# Patient Record
Sex: Female | Born: 1990 | Race: White | Hispanic: Yes | Marital: Single | Smoking: Former smoker
Health system: Southern US, Community
[De-identification: ages and names within clinical notes are randomized; demographics above are authoritative.]

## PROBLEM LIST (undated history)

## (undated) DIAGNOSIS — N390 Urinary tract infection, site not specified: Secondary | ICD-10-CM

## (undated) DIAGNOSIS — Z789 Other specified health status: Secondary | ICD-10-CM

## (undated) DIAGNOSIS — B379 Candidiasis, unspecified: Secondary | ICD-10-CM

## (undated) HISTORY — DX: Other specified health status: Z78.9

## (undated) HISTORY — PX: WISDOM TOOTH EXTRACTION: SHX21

## (undated) HISTORY — DX: Urinary tract infection, site not specified: N39.0

## (undated) HISTORY — DX: Candidiasis, unspecified: B37.9

---
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
## 2013-01-13 ENCOUNTER — Other Ambulatory Visit: Payer: Self-pay | Admitting: Physician Assistant

## 2013-02-09 ENCOUNTER — Other Ambulatory Visit: Payer: Self-pay | Admitting: Physician Assistant

## 2013-02-10 ENCOUNTER — Encounter: Payer: Self-pay | Admitting: Family Medicine

## 2013-02-10 NOTE — Telephone Encounter (Signed)
Medication refill for one time only.  Patient needs to be seen.  Letter sent for patient to call and schedule 

## 2013-03-09 ENCOUNTER — Other Ambulatory Visit: Payer: Self-pay | Admitting: Physician Assistant

## 2013-09-21 ENCOUNTER — Other Ambulatory Visit: Payer: Self-pay | Admitting: Physician Assistant

## 2013-09-22 ENCOUNTER — Encounter: Payer: Self-pay | Admitting: Family Medicine

## 2013-09-22 NOTE — Telephone Encounter (Signed)
Medication refill for one time only.  Patient needs to be seen.  Letter sent for patient to call and schedule 

## 2013-10-23 ENCOUNTER — Other Ambulatory Visit: Payer: Self-pay | Admitting: Physician Assistant

## 2014-11-12 ENCOUNTER — Ambulatory Visit: Payer: Self-pay | Admitting: Physician Assistant

## 2014-11-16 ENCOUNTER — Encounter: Payer: Self-pay | Admitting: Physician Assistant

## 2014-11-16 ENCOUNTER — Ambulatory Visit (INDEPENDENT_AMBULATORY_CARE_PROVIDER_SITE_OTHER): Payer: 59 | Admitting: Physician Assistant

## 2014-11-16 VITALS — BP 110/70 | HR 61 | Temp 98.7°F | Resp 19 | Wt 136.0 lb

## 2014-11-16 DIAGNOSIS — Z124 Encounter for screening for malignant neoplasm of cervix: Secondary | ICD-10-CM | POA: Diagnosis not present

## 2014-11-16 DIAGNOSIS — Z309 Encounter for contraceptive management, unspecified: Secondary | ICD-10-CM | POA: Insufficient documentation

## 2014-11-16 DIAGNOSIS — Z3041 Encounter for surveillance of contraceptive pills: Secondary | ICD-10-CM

## 2014-11-16 DIAGNOSIS — Z01419 Encounter for gynecological examination (general) (routine) without abnormal findings: Secondary | ICD-10-CM

## 2014-11-16 MED ORDER — DESOGESTREL-ETHINYL ESTRADIOL 0.15-0.02/0.01 MG (21/5) PO TABS: ORAL_TABLET | ORAL | Status: DC

## 2014-11-16 NOTE — Progress Notes (Signed)
Patient ID: Allen NorrisGemma L Pond MRN: [id number], DOB: 02/18/1991, 24 y.o. Date of Encounter: @DATE @  Chief Complaint:  Chief Complaint  Patient presents with  . Med Refill    HPI: 24 y.o. year old female  Presents for refill on contraception / birth-control pills. I saw her for office visit 08/14/2011. At that time she was wanting to start birth control pills for the first time.  At that time, she had never been on any type of contraception. Reported that her menses came on a regular basis approximately every 28 days and lasted 5-7 days with minimal cramping on the first day only and the flow was not very heavy. She had no GYN history, no past medical history. She has no history of smoking. At the time of that visit was not a smoker and still is not a smoker at this time. Last menstrual period 11/03/14. She says that she has actually been out of birth control pills for almost a year now. Says that in addition to needing contraception she also has noticed that since being off of the pill her acne is worse and hormone fluctuations are more noticeable. She states that she currently is a Consulting civil engineerstudent in college as well as also working at [employer]. Says that she should graduate next May--after that, plans to continue to get her Masters.  No other complaints or concerns today.   History reviewed. No pertinent past medical history.   Home Meds: Outpatient Prescriptions Prior to Visit  Medication Sig Dispense Refill  . AZURETTE 0.15-0.02/0.01 MG (21/5) tablet TAKE AS DIRECTED 28 tablet 0   No facility-administered medications prior to visit.    Allergies: Not on File  History   Social History  . Marital Status: Single    Spouse Name: N/A  . Number of Children: N/A  . Years of Education: N/A   Occupational History  . Not on file.   Social History Main Topics  . Smoking status: Current Every Day Smoker -- 0.50 packs/day    Types: Cigarettes    Start date: 11/01/2011  . Smokeless  tobacco: Not on file  . Alcohol Use: 0.0 oz/week    0 Standard drinks or equivalent per week  . Drug Use: 5.00 per week    Special: Marijuana  . Sexual Activity: Yes   Other Topics Concern  . Not on file   Social History Narrative  . No narrative on file    History reviewed. No pertinent family history.   Review of Systems:  See HPI for pertinent ROS. All other ROS negative.    Physical Exam: Blood pressure 110/70, pulse 61, temperature 98.7 F (37.1 C), temperature source Oral, resp. rate 19, weight 136 lb (61.689 kg)., There is no height on file to calculate BMI. General: WNWD Female. Appears in no acute distress.  Neck: Supple. No thyromegaly. No lymphadenopathy. Breast Exam: Inspection is normal. Symmetrical. No rashes or skin changes. No masses with palpation. No nipple discharge. Lungs: Clear bilaterally to auscultation without wheezes, rales, or rhonchi. Breathing is unlabored. Heart: RRR with S1 S2. No murmurs, rubs, or gallops. Pelvic Exam: External genitalia normal. Vaginal mucosa normal. Cervix normal. Bimanual exam normal with no adnexal mass. Uterus normal size. No cervical motion tenderness. Musculoskeletal:  Strength and tone normal for age. Extremities/Skin: Warm and dry. Neuro: Alert and oriented X 3. Moves all extremities spontaneously. Gait is normal. CNII-XII grossly in tact. Psych:  Responds to questions appropriately with a normal affect.  ASSESSMENT AND PLAN:  24 y.o. year old female with  1. Encounter for surveillance of contraceptive pills - desogestrel-ethinyl estradiol (AZURETTE) 0.15-0.02/0.01 MG (21/5) tablet; TAKE AS DIRECTED  Dispense: 1 Package; Refill: 11  2. Encounter for cervical Pap smear with pelvic exam - desogestrel-ethinyl estradiol (AZURETTE) 0.15-0.02/0.01 MG (21/5) tablet; TAKE AS DIRECTED  Dispense: 1 Package; Refill: 11 - PAP, ThinPrep ASCUS Rflx HPV Rflx Type  Will follow-up with her with results of Pap smear. Routine  follow-up office visit 1 year. Follow-up sooner if needed.   Signed, [address]Mary Beth SpringfieldDixon, [place], BSFM 11/16/2014 12:00 PM

## 2014-11-17 LAB — PAP THINPREP ASCUS RFLX HPV RFLX TYPE

## 2014-11-18 ENCOUNTER — Encounter: Payer: Self-pay | Admitting: Family Medicine

## 2015-10-30 ENCOUNTER — Other Ambulatory Visit: Payer: Self-pay | Admitting: Physician Assistant

## 2015-11-01 ENCOUNTER — Encounter: Payer: Self-pay | Admitting: Family Medicine

## 2015-11-01 NOTE — Telephone Encounter (Signed)
Medication refill for one time only.  Patient needs to be seen.  Letter sent for patient to call and schedule 

## 2015-11-25 ENCOUNTER — Encounter: Payer: Self-pay | Admitting: *Deleted

## 2015-11-25 ENCOUNTER — Other Ambulatory Visit: Payer: Self-pay | Admitting: Physician Assistant

## 2015-11-25 NOTE — Telephone Encounter (Signed)
Medication filled x1 with no refills.   Requires office visit before any further refills can be given.   Letter sent.  

## 2015-12-27 ENCOUNTER — Other Ambulatory Visit: Payer: Self-pay | Admitting: Physician Assistant

## 2015-12-27 ENCOUNTER — Encounter: Payer: Self-pay | Admitting: Family Medicine

## 2015-12-27 NOTE — Telephone Encounter (Signed)
Medication refill for one time only.  Patient needs to be seen.  Letter sent for patient to call and schedule 

## 2016-01-07 ENCOUNTER — Telehealth: Payer: Self-pay | Admitting: Physician Assistant

## 2016-01-07 NOTE — Telephone Encounter (Signed)
Pt states that her birth control is too expensive and is hoping that we can call in a cheaper medication to the [facility] in [place]

## 2016-01-08 NOTE — Telephone Encounter (Signed)
LOV 11/2014----Due for OV.  Have her schedule OV.  Will give her one month supply of different OCT to use in interim.  Mircette -- Take 1 daily---1 pack + 0

## 2016-01-10 MED ORDER — DESOGESTREL-ETHINYL ESTRADIOL 0.15-0.02/0.01 MG (21/5) PO TABS: 1.0000 | ORAL_TABLET | Freq: Every day | ORAL | Status: DC

## 2016-01-10 NOTE — Telephone Encounter (Signed)
Generic refill called in for one time only.  Patient needs to be seen.  Called and left message for patient to call back so we can schedule yearly physical exam.

## 2016-01-10 NOTE — Telephone Encounter (Signed)
Told mother to have daughter call back to make CPE appt.  Rx for 1 month to pharmacy

## 2018-10-12 ENCOUNTER — Other Ambulatory Visit: Payer: Self-pay

## 2018-10-12 ENCOUNTER — Emergency Department (HOSPITAL_BASED_OUTPATIENT_CLINIC_OR_DEPARTMENT_OTHER)
Admission: EM | Admit: 2018-10-12 | Discharge: 2018-10-12 | Disposition: A | Discharge status: Home / Self Care | Payer: 59 | Attending: Emergency Medicine | Admitting: Emergency Medicine

## 2018-10-12 ENCOUNTER — Encounter (HOSPITAL_BASED_OUTPATIENT_CLINIC_OR_DEPARTMENT_OTHER): Payer: Self-pay | Admitting: Emergency Medicine

## 2018-10-12 DIAGNOSIS — R112 Nausea with vomiting, unspecified: Secondary | ICD-10-CM | POA: Diagnosis present

## 2018-10-12 DIAGNOSIS — O99331 Smoking (tobacco) complicating pregnancy, first trimester: Secondary | ICD-10-CM | POA: Diagnosis not present

## 2018-10-12 DIAGNOSIS — F17210 Nicotine dependence, cigarettes, uncomplicated: Secondary | ICD-10-CM | POA: Insufficient documentation

## 2018-10-12 DIAGNOSIS — Z3491 Encounter for supervision of normal pregnancy, unspecified, first trimester: Secondary | ICD-10-CM | POA: Insufficient documentation

## 2018-10-12 LAB — URINALYSIS, MICROSCOPIC (REFLEX)

## 2018-10-12 LAB — URINALYSIS, ROUTINE W REFLEX MICROSCOPIC
Bilirubin Urine: NEGATIVE
Glucose, UA: NEGATIVE mg/dL
Ketones, ur: >80 mg/dL — AB
Nitrite: NEGATIVE
Protein, ur: 30 mg/dL — AB
Specific Gravity, Urine: >1.03 — ABNORMAL HIGH (ref 1.005–1.030)
pH: 6 (ref 5.0–8.0)

## 2018-10-12 LAB — PREGNANCY, URINE: Preg Test, Ur: POSITIVE — AB

## 2018-10-12 MED ORDER — ONDANSETRON 4 MG PO TBDP: 4.0000 mg | ORAL_TABLET | Freq: Four times a day (QID) | ORAL | 0 refills | Status: DC | PRN

## 2018-10-12 MED ORDER — ONDANSETRON HCL 4 MG/2ML IJ SOLN
INTRAMUSCULAR | Status: AC
  Administered 2018-10-12: 09:00:00 4 mg via INTRAVENOUS
  Filled 2018-10-12: qty 2

## 2018-10-12 MED ORDER — LACTATED RINGERS IV BOLUS
1000.0000 mL | Freq: Once | INTRAVENOUS | Status: AC
  Administered 2018-10-12: 09:00:00 1000 mL via INTRAVENOUS

## 2018-10-12 MED ORDER — PRENATAL COMPLETE 14-0.4 MG PO TABS: 1.0000 | ORAL_TABLET | Freq: Every day | ORAL | 1 refills | Status: DC

## 2018-10-12 MED ORDER — ONDANSETRON HCL 4 MG/2ML IJ SOLN
4.0000 mg | Freq: Once | INTRAMUSCULAR | Status: AC
  Administered 2018-10-12: 09:00:00 4 mg via INTRAVENOUS

## 2018-10-12 MED ORDER — SODIUM CHLORIDE 0.9 % IV SOLN
8.0000 mg | Freq: Once | INTRAVENOUS | Status: DC
  Filled 2018-10-12: qty 4

## 2018-10-12 MED ORDER — PRENATAL COMPLETE 14-0.4 MG PO TABS: 1.0000 | ORAL_TABLET | Freq: Every day | ORAL | 1 refills | Status: AC

## 2018-10-12 NOTE — ED Triage Notes (Signed)
Reports sever vomiting x 2 days but reports having morning sickness x 2.5 weeks.  States possible pregnancy-denies taking at home test.  Additionally endorses diarrhea.  Reports she vomits any fluids she intakes.

## 2018-10-12 NOTE — Discharge Instructions (Signed)
Follow up with OBGYN.

## 2018-10-13 NOTE — ED Provider Notes (Signed)
[facility] EMERGENCY DEPARTMENT Provider Note   CSN: [id number] Arrival date & time: 10/12/18  [id number]    History   Chief Complaint Chief Complaint  Patient presents with  . Emesis    HPI Traci NorrisGemma L Weaver is a 28 y.o. female.   HPI   28 year old female with nausea and vomiting.  Intermittent for the last 2 and half weeks but worse in the past 2 days.  She reports abdominal pain with vomiting only.  She thinks she may be pregnant.  Last menstrual period was sometime in February.  One loose stool yesterday.  No sick contacts.  No fevers.  No urinary complaints.  History reviewed. No pertinent past medical history.  Patient Active Problem List   Diagnosis Date Noted  . Contraception management 11/16/2014    History reviewed. No pertinent surgical history.   OB History   No obstetric history on file.      Home Medications    Prior to Admission medications   Medication Sig Start Date End Date Taking? Authorizing Provider  ondansetron (ZOFRAN ODT) 4 MG disintegrating tablet Take 1 tablet (4 mg total) by mouth every 6 (six) hours as needed for nausea or vomiting. 10/12/18   Raeford RazorKohut, Jahmad Petrich, MD  Prenatal Vit-Fe Fumarate-FA (PRENATAL COMPLETE) 14-0.4 MG TABS Take 1 tablet by mouth daily. 10/12/18   Raeford RazorKohut, Willett Lefeber, MD    Family History History reviewed. No pertinent family history.  Social History Social History   Tobacco Use  . Smoking status: Current Every Day Smoker    Packs/day: 0.50    Types: Cigarettes    Start date: 11/01/2011  Substance Use Topics  . Alcohol use: Yes    Alcohol/week: 0.0 standard drinks  . Drug use: Yes    Frequency: 5.0 times per week    Types: Marijuana     Allergies   Patient has no known allergies.   Review of Systems Review of Systems  All systems reviewed and negative, other than as noted in HPI.  Physical Exam Updated Vital Signs BP 131/76   Pulse 61   Temp 98.9 F (37.2 C) (Oral)   Resp 16   Ht 5\' 2"  (1.575 m)    Wt 63.5 kg   LMP 08/05/2018 (Approximate)   SpO2 100%   BMI 25.61 kg/m   Physical Exam Vitals signs and nursing note reviewed.  Constitutional:      General: She is not in acute distress.    Appearance: She is well-developed.  HENT:     Head: Normocephalic and atraumatic.  Eyes:     General:        Right eye: No discharge.        Left eye: No discharge.     Conjunctiva/sclera: Conjunctivae normal.  Neck:     Musculoskeletal: Neck supple.  Cardiovascular:     Rate and Rhythm: Normal rate and regular rhythm.     Heart sounds: Normal heart sounds. No murmur. No friction rub. No gallop.   Pulmonary:     Effort: Pulmonary effort is normal. No respiratory distress.     Breath sounds: Normal breath sounds.  Abdominal:     General: There is no distension.     Palpations: Abdomen is soft.     Tenderness: There is no abdominal tenderness.  Musculoskeletal:        General: No tenderness.  Skin:    General: Skin is warm and dry.  Neurological:     Mental Status: She is alert.  Psychiatric:  Behavior: Behavior normal.        Thought Content: Thought content normal.      ED Treatments / Results  Labs (all labs ordered are listed, but only abnormal results are displayed) Labs Reviewed  PREGNANCY, URINE - Abnormal; Notable for the following components:      Result Value   Preg Test, Ur POSITIVE (*)    All other components within normal limits  URINALYSIS, ROUTINE W REFLEX MICROSCOPIC - Abnormal; Notable for the following components:   Color, Urine AMBER (*)    APPearance CLOUDY (*)    Specific Gravity, Urine >1.030 (*)    Hgb urine dipstick TRACE (*)    Ketones, ur >80 (*)    Protein, ur 30 (*)    Leukocytes,Ua TRACE (*)    All other components within normal limits  URINALYSIS, MICROSCOPIC (REFLEX) - Abnormal; Notable for the following components:   Bacteria, UA MANY (*)    All other components within normal limits    EKG None  Radiology No results found.   Procedures Procedures (including critical care time)  Medications Ordered in ED Medications  lactated ringers bolus 1,000 mL (0 mLs Intravenous Stopped 10/12/18 0951)  ondansetron (ZOFRAN) injection 4 mg (4 mg Intravenous Given 10/12/18 0844)     Initial Impression / Assessment and Plan / ED Course  I have reviewed the triage vital signs and the nursing notes.  Pertinent labs & imaging results that were available during my care of the patient were reviewed by me and considered in my medical decision making (see chart for details).       28 year old female with nausea/vomiting.  Likely related to early pregnancy.  Pregnancy test is positive.  Abdominal exam is benign.  She is afebrile.  She feels much better after IV fluids and antiemetics.  She is tolerating p.o.  Will discharge with prescription for antiemetics.  Needs to follow-up with OB.  Return precautions discussed.  Final Clinical Impressions(s) / ED Diagnoses   Final diagnoses:  Nausea and vomiting, intractability of vomiting not specified, unspecified vomiting type  First trimester pregnancy    ED Discharge Orders         Ordered    ondansetron (ZOFRAN ODT) 4 MG disintegrating tablet  Every 6 hours PRN,   Status:  Discontinued     10/12/18 0952    Prenatal Vit-Fe Fumarate-FA (PRENATAL COMPLETE) 14-0.4 MG TABS  Daily,   Status:  Discontinued     10/12/18 0952    ondansetron (ZOFRAN ODT) 4 MG disintegrating tablet  Every 6 hours PRN     10/12/18 0957    Prenatal Vit-Fe Fumarate-FA (PRENATAL COMPLETE) 14-0.4 MG TABS  Daily     10/12/18 0957           Raeford Razor, MD 10/13/18 1626

## 2021-08-15 ENCOUNTER — Other Ambulatory Visit: Payer: Self-pay | Admitting: Obstetrics and Gynecology

## 2021-08-15 ENCOUNTER — Other Ambulatory Visit: Payer: Self-pay

## 2021-08-15 DIAGNOSIS — Z3689 Encounter for other specified antenatal screening: Secondary | ICD-10-CM

## 2021-08-17 ENCOUNTER — Ambulatory Visit: Payer: Medicaid Other | Admitting: *Deleted

## 2021-08-17 ENCOUNTER — Ambulatory Visit: Payer: Self-pay

## 2021-08-17 ENCOUNTER — Ambulatory Visit: Payer: Medicaid Other | Attending: Obstetrics and Gynecology

## 2021-08-17 ENCOUNTER — Other Ambulatory Visit: Payer: Self-pay

## 2021-08-17 ENCOUNTER — Ambulatory Visit (HOSPITAL_BASED_OUTPATIENT_CLINIC_OR_DEPARTMENT_OTHER): Payer: Medicaid Other | Admitting: Obstetrics

## 2021-08-17 ENCOUNTER — Ambulatory Visit (HOSPITAL_BASED_OUTPATIENT_CLINIC_OR_DEPARTMENT_OTHER): Payer: Medicaid Other

## 2021-08-17 VITALS — BP 132/79 | HR 84

## 2021-08-17 DIAGNOSIS — O361120 Maternal care for Anti-A sensitization, second trimester, not applicable or unspecified: Secondary | ICD-10-CM

## 2021-08-17 DIAGNOSIS — Z3A18 18 weeks gestation of pregnancy: Secondary | ICD-10-CM | POA: Diagnosis not present

## 2021-08-17 DIAGNOSIS — O321XX0 Maternal care for breech presentation, not applicable or unspecified: Secondary | ICD-10-CM | POA: Insufficient documentation

## 2021-08-17 DIAGNOSIS — O280 Abnormal hematological finding on antenatal screening of mother: Secondary | ICD-10-CM | POA: Diagnosis not present

## 2021-08-17 DIAGNOSIS — O09292 Supervision of pregnancy with other poor reproductive or obstetric history, second trimester: Secondary | ICD-10-CM | POA: Diagnosis not present

## 2021-08-17 DIAGNOSIS — Z363 Encounter for antenatal screening for malformations: Secondary | ICD-10-CM | POA: Insufficient documentation

## 2021-08-17 DIAGNOSIS — O289 Unspecified abnormal findings on antenatal screening of mother: Secondary | ICD-10-CM

## 2021-08-17 DIAGNOSIS — Z3689 Encounter for other specified antenatal screening: Secondary | ICD-10-CM | POA: Diagnosis not present

## 2021-08-17 DIAGNOSIS — O358XX0 Maternal care for other (suspected) fetal abnormality and damage, not applicable or unspecified: Secondary | ICD-10-CM | POA: Diagnosis not present

## 2021-08-17 DIAGNOSIS — O285 Abnormal chromosomal and genetic finding on antenatal screening of mother: Secondary | ICD-10-CM | POA: Diagnosis not present

## 2021-08-17 NOTE — Progress Notes (Signed)
°  Name: LAKESHIA DOHNER Indication: NIPS positive for Trisomy 21  DOB: [birth year] Age: 31 y.o.   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 She screened to not be a carrier for Cystic Fibrosis (CF), Spinal Muscular Atrophy (SMA), alpha thalassemia, and beta hemoglobinopathies. A negative result on carrier screening reduces the likelihood of being a carrier, however, does not entirely rule out the possibility.      Genetic Counseling:   Scottlyn was scheduled for formal genetic counseling after her ultrasound examination on 08/17/2021 to discuss her high risk Non-Invasive Prenatal Screening (NIPS) result. Unfortunately, abnormalities were seen on the ultrasound examination (please see separate report for details). After speaking in depth with Dr. Parke Poisson, Domenic Moras declined amniocentesis for prenatal diagnosis. Akaylah also declined to have formal genetic counseling to discuss the NIPS result at this time. Given the abnormalities seen on ultrasound today Brylynn is considering termination of pregnancy. She signed the [employer] Termination of Pregnancy Written Certification in the [facility] on 08/17/2021. Genetic counseling will follow-up with Jodine over the next few days. In the event she decides she would like to move forward with termination, genetic counseling will help to facilitate this.      Thank you for sharing in the care of Ceri with [place].  Please do not hesitate to contact us if you have any questions.  Teena Dunk, MS, [facility]

## 2021-08-17 NOTE — Progress Notes (Signed)
MFM Note  Traci Weaver was seen today as her cell free DNA test indicated a high risk for trisomy 30 (Down syndrome).  A female fetus was predicted on that test.  The patient reports that [facility] 5 [facility].  She reports one prior full-term uncomplicated vaginal delivery.  She denies any significant past medical history and denies any problems in her current pregnancy.  On today's exam, fetal hydrops along with a cystic hygroma were noted in the fetus.  Whole body edema (anasarca) along with abdominal and thoracic ascites were noted today.  The significantly increased risk of Down syndrome based on today's ultrasound findings along with her positive cell free DNA test was discussed with the patient.    She was advised that definitive diagnosis of Down syndrome and other chromosomal abnormalities can only be made via an amniocentesis.  The risks versus benefits of the amniocentesis procedure were discussed.  The patient was also advised regarding the poor prognosis for her pregnancy due to the significant hydrops/anasarca noted today.  She was advised that most cases of significant hydrops/anasarca noted this early in pregnancy will result in a fetal demise (IUFD) later in her pregnancy (usually by 20 to 22 weeks).    Due to the poor prognosis for her pregnancy, the option of a termination of pregnancy was also discussed today.  She was advised that she is still early enough in her pregnancy to proceed with a legal termination of pregnancy in the state of [place].    The methods for a termination of pregnancy (D&E versus an induction) were discussed.  The patient stated that she would most likely have a D&E procedure should she decide to terminate her pregnancy.  After she discussed all management options with the father of the baby via telephone, she declined to have the amniocentesis performed today.  The patient signed the  72-hour informed consent form for a termination of pregnancy today.  She will consider all management options and will let us know either tomorrow or the day after tomorrow what she would like to do.  She was advised that should she decide to continue expectant management, we will continue to see her for weekly ultrasound exams.  Should an IUFD be noted later in her pregnancy, an induction can be scheduled for her at that time.  Should she decide to proceed with a termination of pregnancy before 20 weeks, I will refer her to [facility] for a D&E procedure.    Our genetic counselor will contact the patient tomorrow afternoon to determine what she would like [place] to do to help her.  The patient was reassured that there was nothing that she did nor anyone else did to have caused the ultrasound findings and her positive cell free DNA test.  She was reassured that most cases of Down syndrome occur randomly in nature.  She was reassured that should this pregnancy not be successful, that most women will have successful outcomes in their subsequent pregnancies.  In her future pregnancies, she should be referred to our office at between 10 to 12 weeks to have a cell free DNA test and first trimester ultrasound performed.  The patient stated that all of her questions were answered today.  A total of 60 minutes was spent counseling and coordinating the care for this patient.  Greater than 50% of the time was spent in direct face-to-face contact.

## 2023-02-21 IMAGING — US US MFM OB DETAIL+14 WK
1 series · 16 of 28 positions shown · non-contrast
Comparison: none

[Series 1: us mfm ob detail+14 wk · 16 of 155 slices shown]
[im 1/155]
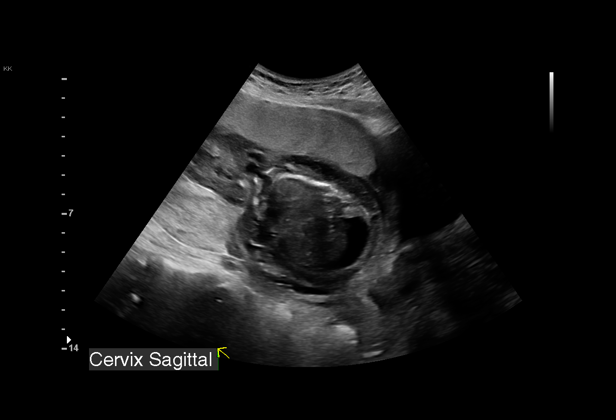
[im 12/155]
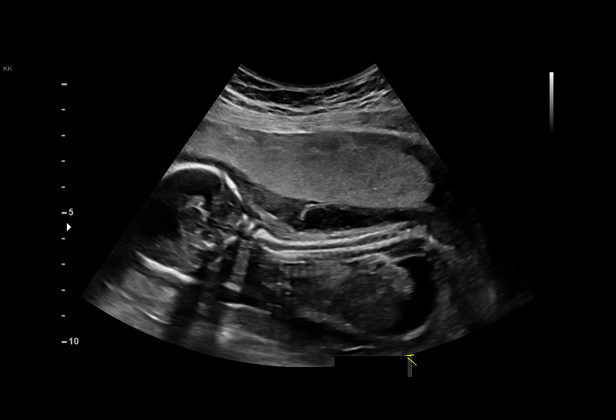
[im 23/155]
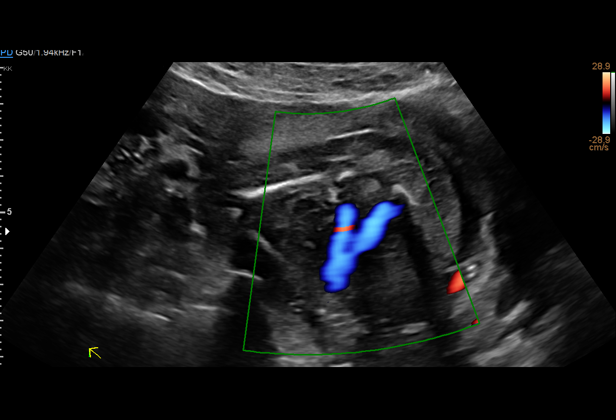
[im 35/155]
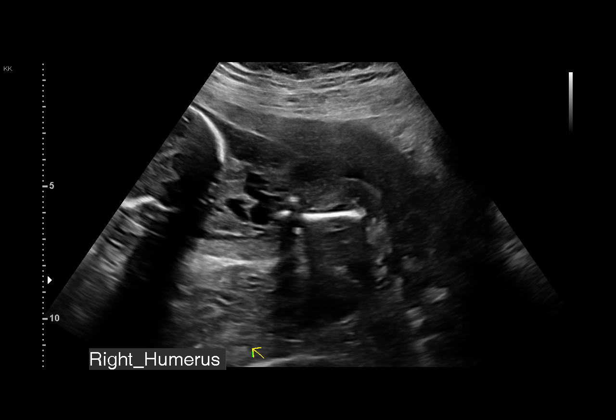
[im 40/155]
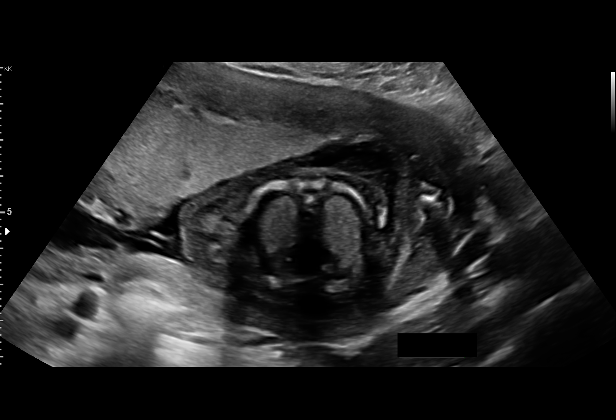
[im 52/155]
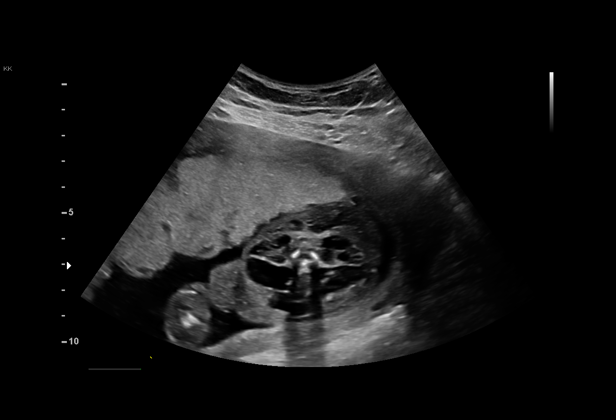
[im 63/155]
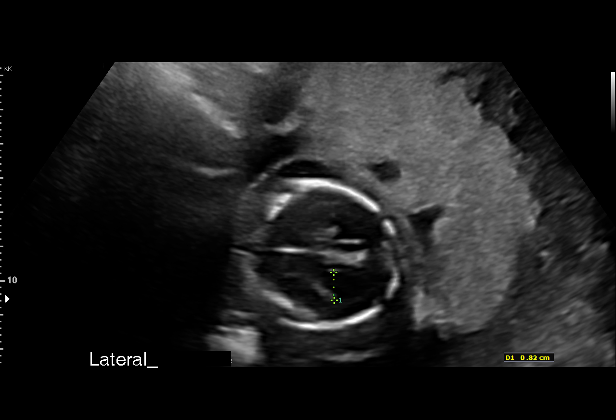
[im 75/155]
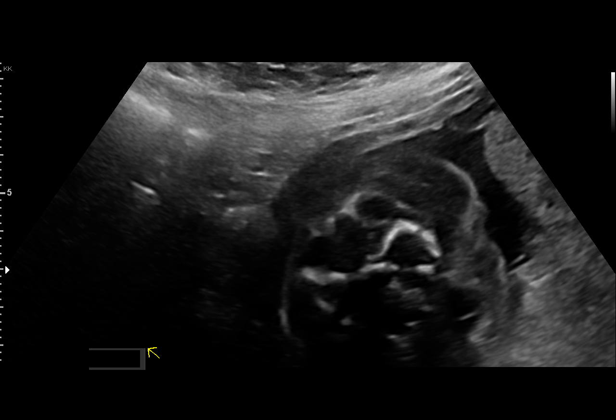
[im 80/155]
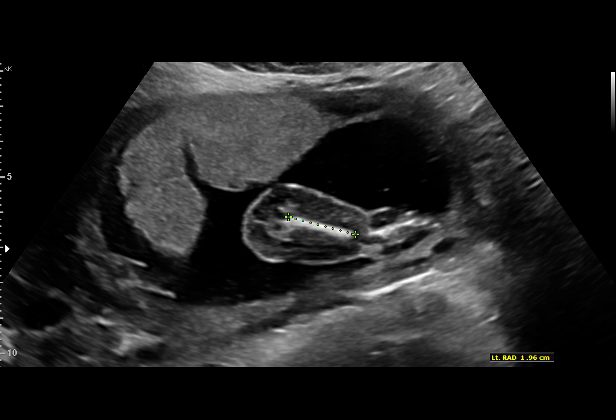
[im 92/155]
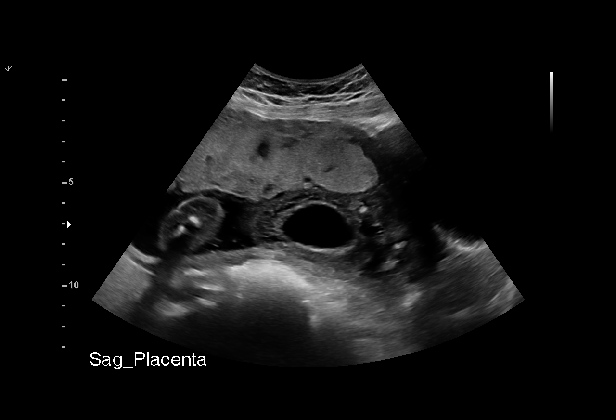
[im 103/155]
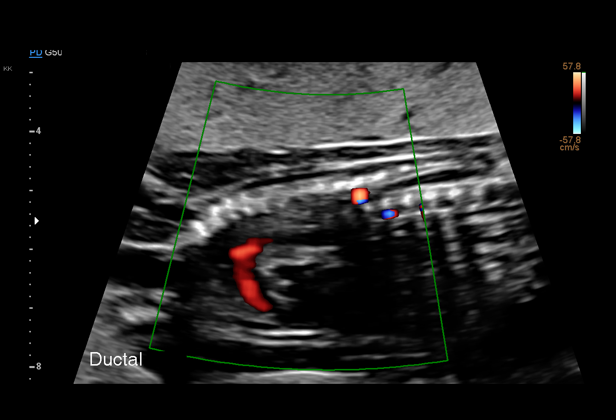
[im 115/155]
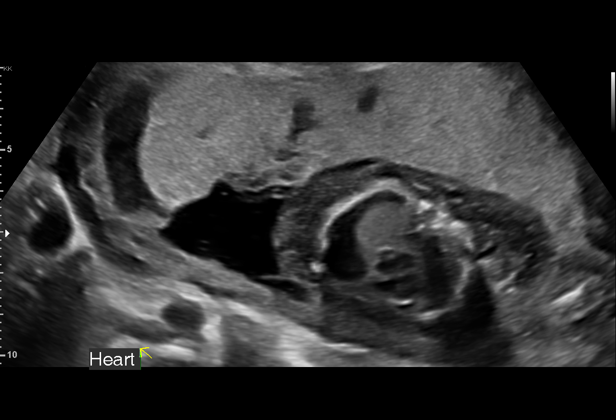
[im 120/155]
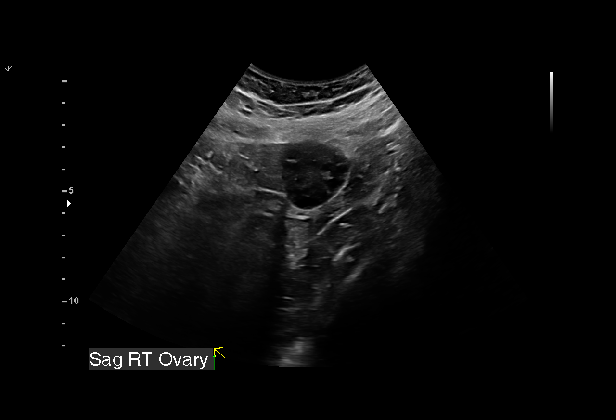
[im 132/155]
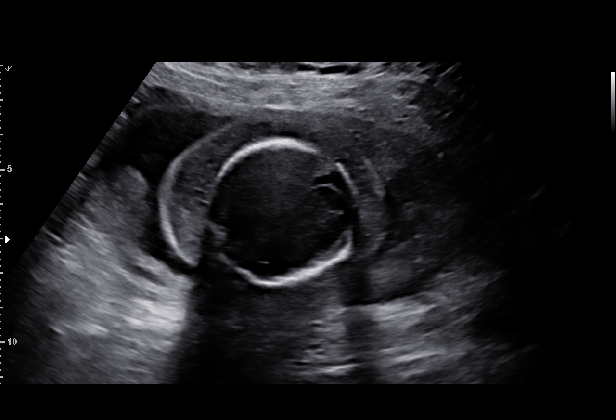
[im 143/155]
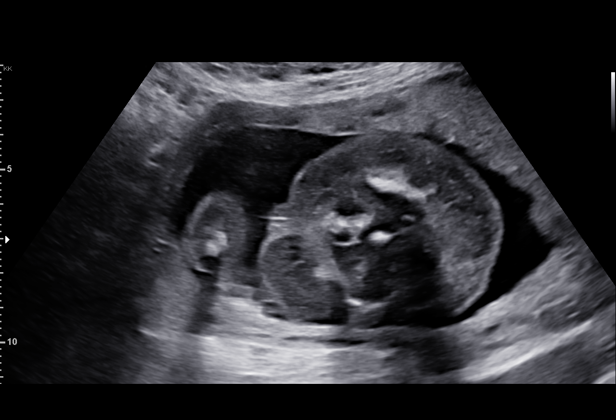
[im 155/155]
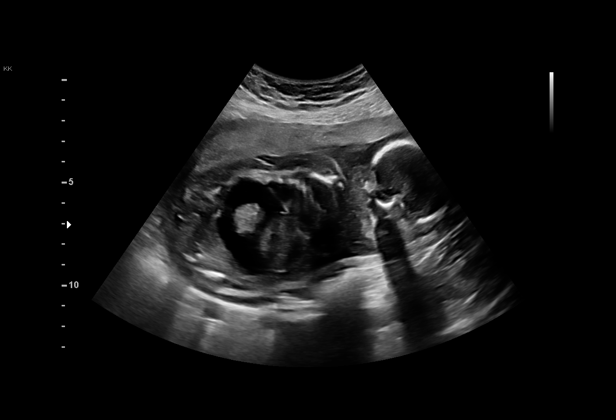

[16 of 28 positions shown; findings below may reference images not displayed]

[facility] [REDACTED]
                                                            [HOSPITAL][HOSPITAL]
                   [facility]                                 [HOSPITAL] at

Indications

 Abnormal chromosomal and genetic finding
 on antenatal screening of mother (NIPS -
 T21)
 18 weeks gestation of pregnancy
 Encounter for antenatal screening for
 malformations
 Negative Horizon
Fetal Evaluation

 Num Of Fetuses:         1
 Fetal Heart Rate(bpm):  150
 Cardiac Activity:       Observed
 Presentation:           Breech
 Placenta:               Anterior
 P. Cord Insertion:      Visualized

 Amniotic Fluid
 AFI FV:      Within normal limits

                             Largest Pocket(cm)

Biometry
 BPD:      43.4  mm     G. Age:  19w 1d         69  %    CI:        82.63   %    70 - 86
                                                         FL/HC:      16.7   %    16.1 -
 HC:      150.6  mm     G. Age:  18w 1d         16  %    HC/AC:      0.77        1.09 -
 AC:       196   mm     G. Age:  24w 2d       > 99  %    FL/BPD:     58.1   %
 FL:       25.2  mm     G. Age:  17w 4d         11  %    FL/AC:      12.9   %    20 - 24
 HUM:      23.2  mm     G. Age:  17w 1d          6  %
 CER:      17.9  mm     G. Age:  17w 6d          9  %
 NFT:      16.7  mm
 CM:        3.9  mm
  RIGHT
 HUM:      24.1  mm     G. Age:  17w 3d         16  %
 ULN:      20.5  mm     G. Age:  17w 1d        < 5  %
 TIB:      25.1  mm     G. Age:  19w 0d         60  %
 RAD:      18.3  mm     G. Age:  16w 1d          5  %
 FIB:      24.5  mm     G. Age:  18w 4d         58  %
  LEFT
 ULN:      21.3  mm     G. Age:  17w 3d          7  %
 TIB:      20.7  mm     G. Age:  17w 2d         11  %
 RAD:      19.6  mm     G. Age:  16w 6d         17  %
 FIB:      24.5  mm     G. Age:  18w 4d         58  %

 Est. FW:     386  gm    0 lb 14 oz    > 99  %
OB History

 Gravidity:    4         Term:   1        Prem:   0        SAB:   1
 TOP:          1       Ectopic:  0        Living: 1
Gestational Age

 LMP:           18w 5d        Date:  04/08/21                 EDD:   01/13/22
 U/S Today:     19w 6d                                        EDD:   01/05/22
 Best:          18w 5d     Det. By:  LMP  (04/08/21)          EDD:   01/13/22
Anatomy

 Cranium:               Skin edema             LVOT:                   Not well visualized
 Cavum:                 Visualized             Aortic Arch:            Visualized
 Ventricles:            Visualized             Ductal Arch:            Visualized
 Choroid Plexus:        Visualized             Diaphragm:              Not well visualized
 Cerebellum:            Visualized             Stomach:                Not well visualized
 Posterior Fossa:       Visualized             Abdomen:                Ascites
 Nuchal Fold:           Cystic hygroma         Abdominal Wall:         Not well visualized
 Face:                  Orbits appear          Cord Vessels:           Appears normal (3
                        normal                                         vessel cord)
 Lips:                  Not well visualized    Kidneys:                Visualized
 Palate:                Not well visualized    Bladder:                Not well visualized
 Thoracic:              Pleural effusion       Spine:                  Visualized
 Heart:                 Not well visualized    Upper Extremities:      Visualized
 RVOT:                  Not well visualized    Lower Extremities:      Visualized
Cervix Uterus Adnexa

 Cervix
 Normal appearance by transabdominal scan.
 Right Ovary
 Within normal limits.

 Left Ovary
 Within normal limits.

 Adnexa
 No abnormality visualized.
Comments

 Molnos Cati was seen today as her cell free DNA test
 indicated a high risk for trisomy 21 (Down syndrome).  A
 female fetus was predicted on that test.  The patient reports
 [facility].  She reports one prior full-
 term uncomplicated vaginal delivery.

 She denies any significant past medical history and denies
 any problems in her current pregnancy.

 On today's exam, fetal hydrops along with a cystic hygroma
 were noted in the fetus.  Whole body edema (anasarca)
 along with abdominal and thoracic ascites were noted today.

 The significantly increased risk of Down syndrome based on
 today's ultrasound findings along with her positive cell free
 DNA test was discussed with the patient.

 She was advised that definitive diagnosis of Down syndrome
 and other chromosomal abnormalities can only be made via
 an amniocentesis.  The risks versus benefits of the
 amniocentesis procedure were discussed.

 The patient was also advised regarding the poor prognosis
 for her pregnancy due to the significant hydrops/anasarca
 noted today.  She was advised that most cases of significant
 hydrops/anasarca noted this early in pregnancy will result in a
 fetal demise (IUFD) later in her pregnancy (usually by 20 to
 22 weeks).

 Due to the poor prognosis for her pregnancy, the option of a
 termination of pregnancy was also discussed today.  She was
 advised that she is still early enough in her pregnancy to
 proceed with [place] termination of pregnancy in the state of
 [place] [HOSPITAL].

 The methods for a termination of pregnancy (D&E versus an
 induction) were discussed.  The patient stated that she would
 most likely have a D&E procedure should she decide to
 terminate her pregnancy.

 After she discussed all management options with the father of
 the baby via telephone, she declined to have the
 amniocentesis performed today.

 The patient signed the 72-hour informed consent form for a
 termination of pregnancy today.  She will consider all
 management options and will let us know either tomorrow or
 the day after tomorrow what she would like to do.

 She was advised that should she decide to continue
 expectant management, we will continue to see her for
 weekly ultrasound exams.  Should an IUFD be noted later in
 her pregnancy, an induction can be scheduled for her at that
 time.

 Should she decide to proceed with a termination of
 pregnancy before 20 weeks, I will refer her to [REDACTED] for a D&E procedure.

 Our genetic counselor will contact the patient tomorrow
 afternoon to determine what she would like us to do to help
 her.

 The patient was reassured that there was nothing that she
 did nor anyone else did to have caused the ultrasound
 findings and her positive cell free DNA test.  She was
 reassured that most cases of Down syndrome occur
 randomly in nature.

 She was reassured that should this pregnancy not be
 successful, that most women will have successful outcomes
 in their subsequent pregnancies.

 In her future pregnancies, she should be referred to our office
 at between 10 to 12 weeks to have a cell free DNA test and
 first trimester ultrasound performed.

 The patient stated that all of her questions were answered
 today.

 A total of 60 minutes was spent counseling and coordinating
 the care for this patient.  Greater than 50% of the time was
 spent in direct face-to-face contact.
